# Patient Record
Sex: Male | Born: 1960 | Race: White | Hispanic: No | Smoking: Never smoker
Health system: Southern US, Community
[De-identification: ages and names within clinical notes are randomized; demographics above are authoritative.]

## PROBLEM LIST (undated history)

## (undated) DIAGNOSIS — R319 Hematuria, unspecified: Secondary | ICD-10-CM

## (undated) DIAGNOSIS — N529 Male erectile dysfunction, unspecified: Secondary | ICD-10-CM

## (undated) DIAGNOSIS — E785 Hyperlipidemia, unspecified: Secondary | ICD-10-CM

## (undated) DIAGNOSIS — F1010 Alcohol abuse, uncomplicated: Secondary | ICD-10-CM

## (undated) DIAGNOSIS — E538 Deficiency of other specified B group vitamins: Secondary | ICD-10-CM

## (undated) DIAGNOSIS — F419 Anxiety disorder, unspecified: Secondary | ICD-10-CM

## (undated) DIAGNOSIS — M5412 Radiculopathy, cervical region: Secondary | ICD-10-CM

## (undated) DIAGNOSIS — F329 Major depressive disorder, single episode, unspecified: Secondary | ICD-10-CM

## (undated) DIAGNOSIS — F32A Depression, unspecified: Secondary | ICD-10-CM

## (undated) DIAGNOSIS — M542 Cervicalgia: Secondary | ICD-10-CM

## (undated) DIAGNOSIS — E559 Vitamin D deficiency, unspecified: Secondary | ICD-10-CM

## (undated) DIAGNOSIS — R7989 Other specified abnormal findings of blood chemistry: Secondary | ICD-10-CM

## (undated) HISTORY — DX: Male erectile dysfunction, unspecified: N52.9

## (undated) HISTORY — DX: Hematuria, unspecified: R31.9

## (undated) HISTORY — DX: Radiculopathy, cervical region: M54.12

## (undated) HISTORY — DX: Major depressive disorder, single episode, unspecified: F32.9

## (undated) HISTORY — DX: Hyperlipidemia, unspecified: E78.5

## (undated) HISTORY — DX: Deficiency of other specified B group vitamins: E53.8

## (undated) HISTORY — DX: Other specified abnormal findings of blood chemistry: R79.89

## (undated) HISTORY — DX: Vitamin D deficiency, unspecified: E55.9

## (undated) HISTORY — DX: Anxiety disorder, unspecified: F41.9

## (undated) HISTORY — DX: Alcohol abuse, uncomplicated: F10.10

## (undated) HISTORY — DX: Cervicalgia: M54.2

## (undated) HISTORY — DX: Depression, unspecified: F32.A

## (undated) HISTORY — PX: TONSILECTOMY/ADENOIDECTOMY WITH MYRINGOTOMY: SHX6125

---

## 2018-05-16 ENCOUNTER — Encounter: Payer: Self-pay | Admitting: Internal Medicine

## 2018-07-20 ENCOUNTER — Telehealth: Payer: Self-pay | Admitting: *Deleted

## 2018-07-20 ENCOUNTER — Encounter: Payer: Self-pay | Admitting: *Deleted

## 2018-07-20 ENCOUNTER — Other Ambulatory Visit: Payer: Self-pay | Admitting: *Deleted

## 2018-07-20 ENCOUNTER — Encounter: Payer: Self-pay | Admitting: Nurse Practitioner

## 2018-07-20 ENCOUNTER — Ambulatory Visit (INDEPENDENT_AMBULATORY_CARE_PROVIDER_SITE_OTHER): Payer: No Typology Code available for payment source | Admitting: Nurse Practitioner

## 2018-07-20 ENCOUNTER — Encounter: Payer: Self-pay | Admitting: Gastroenterology

## 2018-07-20 ENCOUNTER — Other Ambulatory Visit: Payer: Self-pay

## 2018-07-20 DIAGNOSIS — Z800 Family history of malignant neoplasm of digestive organs: Secondary | ICD-10-CM | POA: Diagnosis not present

## 2018-07-20 MED ORDER — PEG 3350-KCL-NA BICARB-NACL 420 G PO SOLR: 4000.0000 mL | Freq: Once | ORAL | 0 refills | Status: AC

## 2018-07-20 NOTE — Patient Instructions (Signed)
Your health issues we discussed today were:   Family history of colon cancer/need for colonoscopy: 1. We will schedule your colonoscopy for you 2. Further recommendations will be made after your colonoscopy  Overall I recommend:  1. Return for follow-up based on recommendations made after your colonoscopy 2. Call us if you have any questions or concerns.    Because of recent events of COVID-19 ("Coronavirus"), follow CDC recommendations:  Wash your hand frequently Avoid touching your face Stay away from people who are sick If you have symptoms such as fever, cough, shortness of breath then call your healthcare provider for further guidance If you are sick, STAY AT HOME unless otherwise directed by your healthcare provider.    At [facility] we value your feedback. You may receive a survey about your visit today. Please share your experience as we strive to create trusting relationships with our patients to provide genuine, compassionate, quality care.  We appreciate your understanding and patience as we review any laboratory studies, imaging, and other diagnostic tests that are ordered as we care for you. Our office policy is 5 business days for review of these results, and any emergent or urgent results are addressed in a timely manner for your best interest. If you do not hear from our office in 1 week, please contact us.   We also encourage the use of MyChart, which contains your medical information for your review as well. If you are not enrolled in this feature, an access code is on this after visit summary for your convenience. Thank you for allowing [place] to be involved in your care.  It was great to see you today!  I hope you have a great day!!

## 2018-07-20 NOTE — Progress Notes (Addendum)
REVIEWED-RSC TCS AFTER JUN 1 DUE TO COVID 19 RESTRICTIONS.  Primary Care Physician:  [facility] Primary Gastroenterologist:  Dr. Oneida Alar  Chief Complaint  Patient presents with  . Colonoscopy    HPI:   Joshua Velasquez is a 58 y.o. male who presents on referral from the [place] to schedule a colonoscopy.  Nurse/phone triage was deferred office visit due to alcohol intake. Reviewed information from the [place] including referral information indicating extensive family history of colon cancer and last colonoscopy in 2014.  He is currently due for screening/surveillance.  No history of colonoscopy in our system.  Today he states he's doing ok overall. Notes intermittent hematochezia about "every so often". Denies abdominal pain, N/V, melena, fever, chills, unintentional weight loss. Denies chest pain, dyspnea, dizziness, lightheadedness, syncope, near syncope. Denies any other upper or lower GI symptoms.  NOT ON ANY MEDICATIONS CURRENTLY  History reviewed. No pertinent past medical history.  History reviewed. No pertinent surgical history.  No current outpatient medications on file.   No current facility-administered medications for this visit.     Allergies as of 07/20/2018  . (No Known Allergies)    Family History  Problem Relation Age of Onset  . Colon cancer Mother   . Colon cancer Father   . Colon cancer Maternal Uncle   . Colon cancer Maternal Aunt   . Colon cancer Paternal Aunt   . Colon cancer Paternal Uncle     Social History   Socioeconomic History  . Marital status: Divorced    Spouse name: Not on file  . Number of children: Not on file  . Years of education: Not on file  . Highest education level: Not on file  Occupational History  . Not on file  Social Needs  . Financial resource strain: Not on file  . Food insecurity:    Worry: Not on file    Inability: Not on file  . Transportation needs:    Medical: Not on file    Non-medical: Not on file   Tobacco Use  . Smoking status: Never Smoker  . Smokeless tobacco: Current User    Types: Chew  . Tobacco comment: "I'll stop chew when they put me in the ground."  Substance and Sexual Activity  . Alcohol use: Yes    Comment: A case of beer per week  . Drug use: Not Currently    Comment: quit 2007 (done anything but pills)  . Sexual activity: Not on file  Lifestyle  . Physical activity:    Days per week: Not on file    Minutes per session: Not on file  . Stress: Not on file  Relationships  . Social connections:    Talks on phone: Not on file    Gets together: Not on file    Attends religious service: Not on file    Active member of club or organization: Not on file    Attends meetings of clubs or organizations: Not on file    Relationship status: Not on file  . Intimate partner violence:    Fear of current or ex partner: Not on file    Emotionally abused: Not on file    Physically abused: Not on file    Forced sexual activity: Not on file  Other Topics Concern  . Not on file  Social History Narrative  . Not on file    Review of Systems: Complete ROS negative except as per HPI.    Physical Exam: BP  118/74   Pulse 64   Temp (!) 97 F (36.1 C) (Oral)   Ht 6\' 1"  (1.854 m)   Wt 199 lb 9.6 oz (90.5 kg)   BMI 26.33 kg/m  General:   Alert and oriented. Pleasant and cooperative. Well-nourished and well-developed.  Eyes:  Without icterus, sclera clear and conjunctiva pink.  Ears:  Normal auditory acuity. Cardiovascular:  S1, S2 present without murmurs appreciated. Extremities without clubbing or edema. Respiratory:  Clear to auscultation bilaterally. No wheezes, rales, or rhonchi. No distress.  Gastrointestinal:  +BS, soft, non-tender and non-distended. No HSM noted. No guarding or rebound. No masses appreciated.  Rectal:  Deferred  Musculoskalatal:  Symmetrical without gross deformities. Skin:  Intact without significant lesions or rashes. Neurologic:  Alert and  oriented x4;  grossly normal neurologically. Psych:  Alert and cooperative. Normal mood and affect. Heme/Lymph/Immune: No excessive bruising noted.    07/20/2018 11:51 AM   Disclaimer: This note was dictated with voice recognition software. Similar sounding words can inadvertently be transcribed and may not be corrected upon review.

## 2018-07-20 NOTE — Telephone Encounter (Signed)
Pre-op scheduled for 09/22/2018 at 10:00am. Pt aware. Letter mailed.

## 2018-07-20 NOTE — Assessment & Plan Note (Addendum)
Extensive family history of colon cancer in his mother, father, multiple aunts and uncles.  Last colonoscopy 2014 and currently due.  No previous colonoscopy report available.  He is generally asymptomatic from a GI standpoint today.  We will proceed with colonoscopy at this time based on [place] requests.  Proceed with colonoscopy on propofol/MAC with Dr. Oneida Alar in the near future. The risks, benefits, and alternatives have been discussed in detail with the patient. They state understanding and desire to proceed.   The patient is not on any medications.  He drinks about a case of beer a week.  No recreational drug use.  We will plan for the procedure on propofol/MAC to promote adequate sedation.

## 2018-07-24 NOTE — Progress Notes (Signed)
CC'D TO PCP °

## 2018-09-13 ENCOUNTER — Telehealth: Payer: Self-pay | Admitting: *Deleted

## 2018-09-13 NOTE — Telephone Encounter (Signed)
Per [facility]: REVIEWED-RSC TCS AFTER JUN 1 DUE TO COVID 19 RESTRICTIONS.

## 2018-09-13 NOTE — Telephone Encounter (Signed)
Called patient and made aware d/y COVID-19 restrictions we will need to r/s procedure that is currently scheduled for 09/28/2018 with [facility]. He voiced understanding. Patient aware we will call him to r/s once we receive august schedule. Endo is aware

## 2018-09-22 ENCOUNTER — Other Ambulatory Visit (HOSPITAL_COMMUNITY): Payer: Non-veteran care

## 2018-09-28 ENCOUNTER — Ambulatory Visit (HOSPITAL_COMMUNITY): Admit: 2018-09-28 | Payer: Non-veteran care | Admitting: Gastroenterology

## 2018-09-28 ENCOUNTER — Encounter (HOSPITAL_COMMUNITY): Payer: Self-pay

## 2018-09-28 SURGERY — COLONOSCOPY WITH PROPOFOL
Anesthesia: Monitor Anesthesia Care

## 2018-10-09 ENCOUNTER — Telehealth: Payer: Self-pay | Admitting: *Deleted

## 2018-10-09 NOTE — Telephone Encounter (Signed)
LMOVM to call back to schedule TCS with propofol with [facility]

## 2018-10-10 ENCOUNTER — Other Ambulatory Visit: Payer: Self-pay | Admitting: *Deleted

## 2018-10-10 DIAGNOSIS — Z800 Family history of malignant neoplasm of digestive organs: Secondary | ICD-10-CM

## 2018-10-10 NOTE — Telephone Encounter (Signed)
Spoke with patient. He is scheduled for 9/22 at 7:30am. Patient aware will mail new instructions with new pre-op appt. He already has his prep at home. Confirmed mailing address. Orders entered.

## 2018-10-11 NOTE — Telephone Encounter (Signed)
Pre-op appt mailed with instructions 

## 2018-12-01 ENCOUNTER — Other Ambulatory Visit: Payer: Self-pay

## 2018-12-01 ENCOUNTER — Emergency Department (HOSPITAL_COMMUNITY)
Admission: EM | Admit: 2018-12-01 | Discharge: 2018-12-02 | Disposition: A | Discharge status: Home / Self Care | Payer: No Typology Code available for payment source | Attending: Emergency Medicine | Admitting: Emergency Medicine

## 2018-12-01 DIAGNOSIS — F1420 Cocaine dependence, uncomplicated: Secondary | ICD-10-CM | POA: Insufficient documentation

## 2018-12-01 DIAGNOSIS — F17290 Nicotine dependence, other tobacco product, uncomplicated: Secondary | ICD-10-CM | POA: Diagnosis not present

## 2018-12-01 DIAGNOSIS — F329 Major depressive disorder, single episode, unspecified: Secondary | ICD-10-CM | POA: Insufficient documentation

## 2018-12-01 DIAGNOSIS — F1020 Alcohol dependence, uncomplicated: Secondary | ICD-10-CM | POA: Diagnosis not present

## 2018-12-01 DIAGNOSIS — R45851 Suicidal ideations: Secondary | ICD-10-CM | POA: Insufficient documentation

## 2018-12-01 DIAGNOSIS — Z046 Encounter for general psychiatric examination, requested by authority: Secondary | ICD-10-CM | POA: Diagnosis present
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
## 2018-12-02 ENCOUNTER — Encounter (HOSPITAL_COMMUNITY): Payer: Self-pay

## 2018-12-02 ENCOUNTER — Other Ambulatory Visit: Payer: Self-pay

## 2018-12-02 LAB — COMPREHENSIVE METABOLIC PANEL
ALT: 23 U/L (ref 0–44)
AST: 28 U/L (ref 15–41)
Albumin: 4.6 g/dL (ref 3.5–5.0)
Alkaline Phosphatase: 76 U/L (ref 38–126)
Anion gap: 9 (ref 5–15)
BUN: 24 mg/dL — ABNORMAL HIGH (ref 6–20)
CO2: 25 mmol/L (ref 22–32)
Calcium: 8.6 mg/dL — ABNORMAL LOW (ref 8.9–10.3)
Chloride: 105 mmol/L (ref 98–111)
Creatinine, Ser: 1.17 mg/dL (ref 0.61–1.24)
GFR calc Af Amer: >60 mL/min (ref >60)
GFR calc non Af Amer: >60 mL/min (ref >60)
Glucose, Bld: 103 mg/dL — ABNORMAL HIGH (ref 70–99)
Potassium: 3.7 mmol/L (ref 3.5–5.1)
Sodium: 139 mmol/L (ref 135–145)
Total Bilirubin: 1 mg/dL (ref 0.3–1.2)
Total Protein: 7.5 g/dL (ref 6.5–8.1)

## 2018-12-02 LAB — RAPID URINE DRUG SCREEN, HOSP PERFORMED
Amphetamines: NOT DETECTED
Barbiturates: NOT DETECTED
Benzodiazepines: NOT DETECTED
Cocaine: POSITIVE — AB
Opiates: NOT DETECTED
Tetrahydrocannabinol: POSITIVE — AB

## 2018-12-02 LAB — CBC
HCT: 43.8 % (ref 39.0–52.0)
Hemoglobin: 14.5 g/dL (ref 13.0–17.0)
MCH: 30.7 pg (ref 26.0–34.0)
MCHC: 33.1 g/dL (ref 30.0–36.0)
MCV: 92.8 fL (ref 80.0–100.0)
Platelets: 285 10*3/uL (ref 150–400)
RBC: 4.72 MIL/uL (ref 4.22–5.81)
RDW: 12.5 % (ref 11.5–15.5)
WBC: 9 10*3/uL (ref 4.0–10.5)
nRBC: 0 % (ref 0.0–0.2)

## 2018-12-02 LAB — ETHANOL: Alcohol, Ethyl (B): <10 mg/dL (ref <10)

## 2018-12-02 MED ORDER — THIAMINE HCL 100 MG/ML IJ SOLN: 100.0000 mg | Freq: Every day | INTRAMUSCULAR | Status: DC

## 2018-12-02 MED ORDER — ZOLPIDEM TARTRATE 5 MG PO TABS
5.0000 mg | ORAL_TABLET | Freq: Every evening | ORAL | Status: DC | PRN
  Administered 2018-12-02: 5 mg via ORAL
  Filled 2018-12-02: qty 1

## 2018-12-02 MED ORDER — ONDANSETRON HCL 4 MG PO TABS: 4.0000 mg | ORAL_TABLET | Freq: Three times a day (TID) | ORAL | Status: DC | PRN

## 2018-12-02 MED ORDER — LORAZEPAM 1 MG PO TABS
0.0000 mg | ORAL_TABLET | Freq: Four times a day (QID) | ORAL | Status: DC
  Administered 2018-12-02: 2 mg via ORAL
  Filled 2018-12-02: qty 2

## 2018-12-02 MED ORDER — ACETAMINOPHEN 325 MG PO TABS: 650.0000 mg | ORAL_TABLET | ORAL | Status: DC | PRN

## 2018-12-02 MED ORDER — LORAZEPAM 2 MG/ML IJ SOLN: 0.0000 mg | Freq: Four times a day (QID) | INTRAMUSCULAR | Status: DC

## 2018-12-02 MED ORDER — LORAZEPAM 1 MG PO TABS: 0.0000 mg | ORAL_TABLET | Freq: Two times a day (BID) | ORAL | Status: DC

## 2018-12-02 MED ORDER — ALUM & MAG HYDROXIDE-SIMETH 200-200-20 MG/5ML PO SUSP: 30.0000 mL | Freq: Four times a day (QID) | ORAL | Status: DC | PRN

## 2018-12-02 MED ORDER — LORAZEPAM 2 MG/ML IJ SOLN: 0.0000 mg | Freq: Two times a day (BID) | INTRAMUSCULAR | Status: DC

## 2018-12-02 MED ORDER — VITAMIN B-1 100 MG PO TABS: 100.0000 mg | ORAL_TABLET | Freq: Every day | ORAL | Status: DC

## 2018-12-02 NOTE — BH Assessment (Signed)
[place] Assessment Progress Note   Per Priscille Loveless, NP, inpatient treatment is recommended

## 2018-12-02 NOTE — ED Provider Notes (Signed)
Patient rechecked prior to discharge.  No suicidal or homicidal ideation.  Will discharge for community mental health follow-up   Nat Christen, MD 12/02/18 1207
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
## 2018-12-02 NOTE — ED Notes (Signed)
TTS in progress 
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
## 2018-12-02 NOTE — Discharge Instructions (Addendum)
Behavioral health has cleared you to go home.  Follow-up with community mental health resources.
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
## 2018-12-02 NOTE — ED Notes (Signed)
Ford from [facility] called to state Pt would need to be re-evaluated in the morning.

## 2018-12-02 NOTE — ED Notes (Addendum)
IVC paperwork faxed to [facility] @ [phone]

## 2018-12-02 NOTE — ED Notes (Signed)
Tele-psych talking to patient now.
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
## 2019-01-25 NOTE — Patient Instructions (Signed)
Joshua Velasquez  01/25/2019     @PREFPERIOPPHARMACY @   Your procedure is scheduled on  01/30/2019.  Report to [facility] at [phone]  A.M.  Call this number if you have problems the morning of surgery:  [phone]   Remember:  Follow the diet and prep instructions given to you by Dr Nona Dell office.                       Take these medicines the morning of surgery with A SIP OF WATER  None    Do not wear jewelry, make-up or nail polish.  Do not wear lotions, powders, or perfumes. Please wear deodorant and brush your teeth.  Do not shave 48 hours prior to surgery.  Men may shave face and neck.  Do not bring valuables to the hospital.  [facility] is not responsible for any belongings or valuables.  Contacts, dentures or bridgework may not be worn into surgery.  Leave your suitcase in the car.  After surgery it may be brought to your room.  For patients admitted to the hospital, discharge time will be determined by your treatment team.  Patients discharged the day of surgery will not be allowed to drive home.   Name and phone number of your driver:   family Special instructions:  None  Please read over the following fact sheets that you were given. Anesthesia Post-op Instructions and Care and Recovery After Surgery       Colonoscopy, Adult, Care After This sheet gives you information about how to care for yourself after your procedure. Your health care provider may also give you more specific instructions. If you have problems or questions, contact your health care provider. What can I expect after the procedure? After the procedure, it is common to have:  A small amount of blood in your stool for 24 hours after the procedure.  Some gas.  Mild abdominal cramping or bloating. Follow these instructions at home: General instructions  For the first 24 hours after the procedure: ? Do not drive or use machinery. ? Do not sign important documents. ? Do not drink  alcohol. ? Do your regular daily activities at a slower pace than normal. ? Eat soft, easy-to-digest foods.  Take over-the-counter or prescription medicines only as told by your health care provider. Relieving cramping and bloating   Try walking around when you have cramps or feel bloated.  Apply heat to your abdomen as told by your health care provider. Use a heat source that your health care provider recommends, such as a moist heat pack or a heating pad. ? Place a towel between your skin and the heat source. ? Leave the heat on for 20-30 minutes. ? Remove the heat if your skin turns bright red. This is especially important if you are unable to feel pain, heat, or cold. You may have a greater risk of getting burned. Eating and drinking   Drink enough fluid to keep your urine pale yellow.  Resume your normal diet as instructed by your health care provider. Avoid heavy or fried foods that are hard to digest.  Avoid drinking alcohol for as long as instructed by your health care provider. Contact a health care provider if:  You have blood in your stool 2-3 days after the procedure. Get help right away if:  You have more than a small spotting of blood in your stool.  You pass large blood  clots in your stool.  Your abdomen is swollen.  You have nausea or vomiting.  You have a fever.  You have increasing abdominal pain that is not relieved with medicine. Summary  After the procedure, it is common to have a small amount of blood in your stool. You may also have mild abdominal cramping and bloating.  For the first 24 hours after the procedure, do not drive or use machinery, sign important documents, or drink alcohol.  Contact your health care provider if you have a lot of blood in your stool, nausea or vomiting, a fever, or increased abdominal pain. This information is not intended to replace advice given to you by your health care provider. Make sure you discuss any questions  you have with your health care provider. Document Released: 12/09/2003 Document Revised: 02/16/2017 Document Reviewed: 07/08/2015 Elsevier Patient Education  2020 [place] After These instructions provide you with information about caring for yourself after your procedure. Your health care provider may also give you more specific instructions. Your treatment has been planned according to current medical practices, but problems sometimes occur. Call your health care provider if you have any problems or questions after your procedure. What can I expect after the procedure? After your procedure, you may:  Feel sleepy for several hours.  Feel clumsy and have poor balance for several hours.  Feel forgetful about what happened after the procedure.  Have poor judgment for several hours.  Feel nauseous or vomit.  Have a sore throat if you had a breathing tube during the procedure. Follow these instructions at home: For at least 24 hours after the procedure:      Have a responsible adult stay with you. It is important to have someone help care for you until you are awake and alert.  Rest as needed.  Do not: ? Participate in activities in which you could fall or become injured. ? Drive. ? Use heavy machinery. ? Drink alcohol. ? Take sleeping pills or medicines that cause drowsiness. ? Make important decisions or sign legal documents. ? Take care of children on your own. Eating and drinking  Follow the diet that is recommended by your health care provider.  If you vomit, drink water, juice, or soup when you can drink without vomiting.  Make sure you have little or no nausea before eating solid foods. General instructions  Take over-the-counter and prescription medicines only as told by your health care provider.  If you have sleep apnea, surgery and certain medicines can increase your risk for breathing problems. Follow instructions from your  health care provider about wearing your sleep device: ? Anytime you are sleeping, including during daytime naps. ? While taking prescription pain medicines, sleeping medicines, or medicines that make you drowsy.  If you smoke, do not smoke without supervision.  Keep all follow-up visits as told by your health care provider. This is important. Contact a health care provider if:  You keep feeling nauseous or you keep vomiting.  You feel light-headed.  You develop a rash.  You have a fever. Get help right away if:  You have trouble breathing. Summary  For several hours after your procedure, you may feel sleepy and have poor judgment.  Have a responsible adult stay with you for at least 24 hours or until you are awake and alert. This information is not intended to replace advice given to you by your health care provider. Make sure you discuss any questions you  have with your health care provider. Document Released: 08/17/2015 Document Revised: 07/25/2017 Document Reviewed: 08/17/2015 Elsevier Patient Education  2020 [employer].

## 2019-01-26 ENCOUNTER — Encounter (HOSPITAL_COMMUNITY)
Admission: RE | Admit: 2019-01-26 | Discharge: 2019-01-26 | Disposition: A | Discharge status: Home / Self Care | Payer: No Typology Code available for payment source | Source: Ambulatory Visit | Attending: Gastroenterology | Admitting: Gastroenterology

## 2019-01-26 ENCOUNTER — Other Ambulatory Visit: Payer: Self-pay

## 2019-01-26 ENCOUNTER — Encounter (HOSPITAL_COMMUNITY): Payer: Self-pay

## 2019-01-26 ENCOUNTER — Other Ambulatory Visit (HOSPITAL_COMMUNITY)
Admission: RE | Admit: 2019-01-26 | Discharge: 2019-01-26 | Disposition: A | Discharge status: Home / Self Care | Payer: No Typology Code available for payment source | Source: Ambulatory Visit | Attending: Gastroenterology | Admitting: Gastroenterology

## 2019-01-26 DIAGNOSIS — Z01812 Encounter for preprocedural laboratory examination: Secondary | ICD-10-CM | POA: Insufficient documentation

## 2019-01-26 DIAGNOSIS — K5790 Diverticulosis of intestine, part unspecified, without perforation or abscess without bleeding: Secondary | ICD-10-CM | POA: Diagnosis not present

## 2019-01-26 DIAGNOSIS — K649 Unspecified hemorrhoids: Secondary | ICD-10-CM | POA: Diagnosis not present

## 2019-01-26 DIAGNOSIS — K635 Polyp of colon: Secondary | ICD-10-CM | POA: Insufficient documentation

## 2019-01-26 DIAGNOSIS — Z20828 Contact with and (suspected) exposure to other viral communicable diseases: Secondary | ICD-10-CM | POA: Diagnosis not present

## 2019-01-26 DIAGNOSIS — Z8371 Family history of colonic polyps: Secondary | ICD-10-CM | POA: Insufficient documentation

## 2019-01-26 LAB — COMPREHENSIVE METABOLIC PANEL
ALT: 22 U/L (ref 0–44)
AST: 27 U/L (ref 15–41)
Albumin: 4.1 g/dL (ref 3.5–5.0)
Alkaline Phosphatase: 93 U/L (ref 38–126)
Anion gap: 6 (ref 5–15)
BUN: 17 mg/dL (ref 6–20)
CO2: 30 mmol/L (ref 22–32)
Calcium: 8.7 mg/dL — ABNORMAL LOW (ref 8.9–10.3)
Chloride: 102 mmol/L (ref 98–111)
Creatinine, Ser: 0.91 mg/dL (ref 0.61–1.24)
GFR calc Af Amer: >60 mL/min (ref >60)
GFR calc non Af Amer: >60 mL/min (ref >60)
Glucose, Bld: 137 mg/dL — ABNORMAL HIGH (ref 70–99)
Potassium: 4.3 mmol/L (ref 3.5–5.1)
Sodium: 138 mmol/L (ref 135–145)
Total Bilirubin: 0.4 mg/dL (ref 0.3–1.2)
Total Protein: 7 g/dL (ref 6.5–8.1)

## 2019-01-26 LAB — SARS CORONAVIRUS 2 (TAT 6-24 HRS): SARS Coronavirus 2: NEGATIVE

## 2019-01-30 ENCOUNTER — Ambulatory Visit (HOSPITAL_COMMUNITY): Payer: No Typology Code available for payment source | Admitting: Anesthesiology

## 2019-01-30 ENCOUNTER — Encounter (HOSPITAL_COMMUNITY)
Admission: RE | Disposition: A | Discharge status: Home / Self Care | Payer: Self-pay | Source: Home / Self Care | Attending: Gastroenterology

## 2019-01-30 ENCOUNTER — Encounter (HOSPITAL_COMMUNITY): Payer: Self-pay | Admitting: *Deleted

## 2019-01-30 ENCOUNTER — Ambulatory Visit (HOSPITAL_COMMUNITY)
Admission: RE | Admit: 2019-01-30 | Discharge: 2019-01-30 | Disposition: A | Discharge status: Home / Self Care | Payer: No Typology Code available for payment source | Attending: Gastroenterology | Admitting: Gastroenterology

## 2019-01-30 ENCOUNTER — Other Ambulatory Visit: Payer: Self-pay

## 2019-01-30 DIAGNOSIS — Z1211 Encounter for screening for malignant neoplasm of colon: Secondary | ICD-10-CM | POA: Diagnosis not present

## 2019-01-30 DIAGNOSIS — D128 Benign neoplasm of rectum: Secondary | ICD-10-CM | POA: Insufficient documentation

## 2019-01-30 DIAGNOSIS — K621 Rectal polyp: Secondary | ICD-10-CM

## 2019-01-30 DIAGNOSIS — F17220 Nicotine dependence, chewing tobacco, uncomplicated: Secondary | ICD-10-CM | POA: Insufficient documentation

## 2019-01-30 DIAGNOSIS — K648 Other hemorrhoids: Secondary | ICD-10-CM | POA: Insufficient documentation

## 2019-01-30 DIAGNOSIS — Z800 Family history of malignant neoplasm of digestive organs: Secondary | ICD-10-CM | POA: Diagnosis not present

## 2019-01-30 DIAGNOSIS — K635 Polyp of colon: Secondary | ICD-10-CM | POA: Diagnosis not present

## 2019-01-30 DIAGNOSIS — K644 Residual hemorrhoidal skin tags: Secondary | ICD-10-CM | POA: Diagnosis not present

## 2019-01-30 DIAGNOSIS — D122 Benign neoplasm of ascending colon: Secondary | ICD-10-CM | POA: Insufficient documentation

## 2019-01-30 DIAGNOSIS — K5730 Diverticulosis of large intestine without perforation or abscess without bleeding: Secondary | ICD-10-CM | POA: Insufficient documentation

## 2019-01-30 HISTORY — PX: COLONOSCOPY WITH PROPOFOL: SHX5780

## 2019-01-30 HISTORY — PX: POLYPECTOMY: SHX5525

## 2019-01-30 SURGERY — COLONOSCOPY WITH PROPOFOL
Anesthesia: General

## 2019-01-30 MED ORDER — KETAMINE HCL 50 MG/5ML IJ SOSY
PREFILLED_SYRINGE | INTRAMUSCULAR | Status: AC
  Filled 2019-01-30: qty 5

## 2019-01-30 MED ORDER — CHLORHEXIDINE GLUCONATE CLOTH 2 % EX PADS: 6.0000 | MEDICATED_PAD | Freq: Once | CUTANEOUS | Status: DC

## 2019-01-30 MED ORDER — MIDAZOLAM HCL 2 MG/2ML IJ SOLN: 0.5000 mg | Freq: Once | INTRAMUSCULAR | Status: DC | PRN

## 2019-01-30 MED ORDER — PROPOFOL 10 MG/ML IV BOLUS
INTRAVENOUS | Status: DC | PRN
  Administered 2019-01-30: 20 mg via INTRAVENOUS

## 2019-01-30 MED ORDER — LACTATED RINGERS IV SOLN
INTRAVENOUS | Status: DC
  Administered 2019-01-30: 07:00:00 via INTRAVENOUS

## 2019-01-30 MED ORDER — ONDANSETRON HCL 4 MG/2ML IJ SOLN
INTRAMUSCULAR | Status: AC
  Filled 2019-01-30: qty 2

## 2019-01-30 MED ORDER — HYDROCODONE-ACETAMINOPHEN 7.5-325 MG PO TABS: 1.0000 | ORAL_TABLET | Freq: Once | ORAL | Status: DC | PRN

## 2019-01-30 MED ORDER — PROPOFOL 500 MG/50ML IV EMUL
INTRAVENOUS | Status: DC | PRN
  Administered 2019-01-30: 08:00:00 via INTRAVENOUS
  Administered 2019-01-30: 150 ug/kg/min via INTRAVENOUS

## 2019-01-30 MED ORDER — LIDOCAINE HCL (CARDIAC) PF 100 MG/5ML IV SOSY
PREFILLED_SYRINGE | INTRAVENOUS | Status: DC | PRN
  Administered 2019-01-30: 40 mg via INTRAVENOUS

## 2019-01-30 MED ORDER — HYDROMORPHONE HCL 1 MG/ML IJ SOLN: 0.2500 mg | INTRAMUSCULAR | Status: DC | PRN

## 2019-01-30 MED ORDER — GLYCOPYRROLATE 0.2 MG/ML IJ SOLN
INTRAMUSCULAR | Status: DC | PRN
  Administered 2019-01-30: 0.2 mg via INTRAVENOUS

## 2019-01-30 MED ORDER — PROMETHAZINE HCL 25 MG/ML IJ SOLN: 6.2500 mg | INTRAMUSCULAR | Status: DC | PRN

## 2019-01-30 MED ORDER — KETAMINE HCL 10 MG/ML IJ SOLN
INTRAMUSCULAR | Status: DC | PRN
  Administered 2019-01-30: 10 mg via INTRAVENOUS

## 2019-01-30 NOTE — Anesthesia Postprocedure Evaluation (Signed)
Anesthesia Post Note  Patient: Joshua Velasquez  Procedure(s) Performed: COLONOSCOPY WITH PROPOFOL (N/A ) POLYPECTOMY  Patient location during evaluation: PACU Anesthesia Type: General Level of consciousness: awake and alert and oriented Pain management: pain level controlled Vital Signs Assessment: post-procedure vital signs reviewed and stable Respiratory status: spontaneous breathing Cardiovascular status: stable Postop Assessment: no apparent nausea or vomiting Anesthetic complications: no     Last Vitals:  Vitals:   01/30/19 0643  BP: 115/80  Pulse: (!) 56  Resp: 18  Temp: 36.7 C  SpO2: 95%    Last Pain:  Vitals:   01/30/19 0749  TempSrc:   PainSc: 0-No pain                 Cassey Bacigalupo A

## 2019-01-30 NOTE — Discharge Instructions (Signed)
You have internal hemorrhoids and diverticulosis IN YOUR LEFT COLON. YOU HAD FOUR POLYPS REMOVED.    DRINK WATER TO KEEP YOUR URINE LIGHT YELLOW.  FOLLOW A HIGH FIBER DIET. AVOID ITEMS THAT CAUSE BLOATING. See info below.   USE PREPARATION H FOUR TIMES  A DAY IF NEEDED TO RELIEVE RECTAL PAIN/PRESSURE/BLEEDING.   YOUR BIOPSY RESULTS WILL BE BACK IN 5 BUSINESS DAYS.  Next colonoscopy WILL BE SCHEDULED BASED ON THE FINAL PATH REPORT(3-5 YRS).   Colonoscopy Care After Read the instructions outlined below and refer to this sheet in the next week. These discharge instructions provide you with general information on caring for yourself after you leave the hospital. While your treatment has been planned according to the most current medical practices available, unavoidable complications occasionally occur. If you have any problems or questions after discharge, call DR. Annelisa Ryback, [phone].  ACTIVITY  You may resume your regular activity, but move at a slower pace for the next 24 hours.   Take frequent rest periods for the next 24 hours.   Walking will help get rid of the air and reduce the bloated feeling in your belly (abdomen).   No driving for 24 hours (because of the medicine (anesthesia) used during the test).   You may shower.   Do not sign any important legal documents or operate any machinery for 24 hours (because of the anesthesia used during the test).    NUTRITION  Drink plenty of fluids.   You may resume your normal diet as instructed by your doctor.   Begin with a light meal and progress to your normal diet. Heavy or fried foods are harder to digest and may make you feel sick to your stomach (nauseated).   Avoid alcoholic beverages for 24 hours or as instructed.    MEDICATIONS  You may resume your normal medications.   WHAT YOU CAN EXPECT TODAY  Some feelings of bloating in the abdomen.   Passage of more gas than usual.   Spotting of blood in your stool  or on the toilet paper  .  IF YOU HAD POLYPS REMOVED DURING THE COLONOSCOPY:  Eat a soft diet IF YOU HAVE NAUSEA, BLOATING, ABDOMINAL PAIN, OR VOMITING.    FINDING OUT THE RESULTS OF YOUR TEST Not all test results are available during your visit. DR. Oneida Alar WILL CALL YOU WITHIN 14 DAYS OF YOUR PROCEDUE WITH YOUR RESULTS. Do not assume everything is normal if you have not heard from DR. Shaindy Reader, CALL HER OFFICE AT [phone].  SEEK IMMEDIATE MEDICAL ATTENTION AND CALL THE OFFICE: [phone] IF:  You have more than a spotting of blood in your stool.   Your belly is swollen (abdominal distention).   You are nauseated or vomiting.   You have a temperature over 101F.   You have abdominal pain or discomfort that is severe or gets worse throughout the day.  High-Fiber Diet A high-fiber diet changes your normal diet to include more whole grains, legumes, fruits, and vegetables. Changes in the diet involve replacing refined carbohydrates with unrefined foods. The calorie level of the diet is essentially unchanged. The Dietary Reference Intake (recommended amount) for adult males is 38 grams per day. For adult females, it is 25 grams per day. Pregnant and lactating women should consume 28 grams of fiber per day. Fiber is the intact part of a plant that is not broken down during digestion. Functional fiber is fiber that has been isolated from the plant to provide a beneficial effect  in the body.  PURPOSE  Increase stool bulk.   Ease and regulate bowel movements.   Lower cholesterol.   REDUCE RISK OF COLON CANCER  INDICATIONS THAT YOU NEED MORE FIBER  Constipation and hemorrhoids.   Uncomplicated diverticulosis (intestine condition) and irritable bowel syndrome.   Weight management.   As a protective measure against hardening of the arteries (atherosclerosis), diabetes, and cancer.   GUIDELINES FOR INCREASING FIBER IN THE DIET  Start adding fiber to the diet slowly. A gradual  increase of about 5 more grams (2 servings of most fruits or vegetables) per day is best. Too rapid an increase in fiber may result in constipation, flatulence, and bloating.   Drink enough water and fluids to keep your urine clear or pale yellow. Water, juice, or caffeine-free drinks are recommended. Not drinking enough fluid may cause constipation.   Eat a variety of high-fiber foods rather than one type of fiber.   Try to increase your intake of fiber through using high-fiber foods rather than fiber pills or supplements that contain small amounts of fiber.   The goal is to change the types of food eaten. Do not supplement your present diet with high-fiber foods, but replace foods in your present diet.    Polyps, Colon  A polyp is extra tissue that grows inside your body. Colon polyps grow in the large intestine. The large intestine, also called the colon, is part of your digestive system. It is a long, hollow tube at the end of your digestive tract where your body makes and stores stool. Most polyps are not dangerous. They are benign. This means they are not cancerous. But over time, some types of polyps can turn into cancer. Polyps that are smaller than a pea are usually not harmful. But larger polyps could someday become or may already be cancerous. To be safe, doctors remove all polyps and test them.   PREVENTION There is not one sure way to prevent polyps. You might be able to lower your risk of getting them if you:  Eat more fruits and vegetables and less fatty food.   Do not smoke.   Avoid alcohol.   Exercise every day.   Lose weight if you are overweight.   Eating more calcium and folate can also lower your risk of getting polyps. Some foods that are rich in calcium are milk, cheese, and broccoli. Some foods that are rich in folate are chickpeas, kidney beans, and spinach.    Diverticulosis Diverticulosis is a common condition that develops when small pouches (diverticula)  form in the wall of the colon. The risk of diverticulosis increases with age. It happens more often in people who eat a low-fiber diet. Most individuals with diverticulosis have no symptoms. Those individuals with symptoms usually experience belly (abdominal) pain, constipation, or loose stools (diarrhea).  HOME CARE INSTRUCTIONS  Increase the amount of fiber in your diet as directed by your caregiver or dietician. This may reduce symptoms of diverticulosis.   Drink at least 6 to 8 glasses of water each day to prevent constipation.   Try not to strain when you have a bowel movement.   Avoiding nuts and seeds to prevent complications is NOT NECESSARY.   FOODS HAVING HIGH FIBER CONTENT INCLUDE:  Fruits. Apple, peach, pear, tangerine, raisins, prunes.   Vegetables. Brussels sprouts, asparagus, broccoli, cabbage, carrot, cauliflower, romaine lettuce, spinach, summer squash, tomato, winter squash, zucchini.   Starchy Vegetables. Baked beans, kidney beans, lima beans, split peas, lentils, potatoes (  with skin).   SEEK IMMEDIATE MEDICAL CARE IF:  You develop increasing pain or severe bloating.   You have an oral temperature above 101F.   You develop vomiting or bowel movements that are bloody or black.

## 2019-01-30 NOTE — Anesthesia Preprocedure Evaluation (Signed)
Anesthesia Evaluation  Patient identified by MRN, date of birth, ID band Patient awake    Reviewed: Allergy & Precautions, NPO status , Patient's Chart, lab work & pertinent test results  Airway Mallampati: II  TM Distance: >3 FB Neck ROM: Full    Dental no notable dental hx. (+) Teeth Intact   Pulmonary neg pulmonary ROS,  Tobacco chewer   Pulmonary exam normal breath sounds clear to auscultation       Cardiovascular Exercise Tolerance: Good negative cardio ROS Normal cardiovascular examI Rhythm:Regular Rate:Normal     Neuro/Psych PSYCHIATRIC DISORDERS Anxiety Depression  Neuromuscular disease    GI/Hepatic negative GI ROS, Neg liver ROS,   Endo/Other  negative endocrine ROS  Renal/GU negative Renal ROS  negative genitourinary   Musculoskeletal negative musculoskeletal ROS (+)   Abdominal   Peds negative pediatric ROS (+)  Hematology negative hematology ROS (+)   Anesthesia Other Findings   Reproductive/Obstetrics negative OB ROS                             Anesthesia Physical Anesthesia Plan  ASA: II  Anesthesia Plan: General   Post-op Pain Management:    Induction: Intravenous  PONV Risk Score and Plan: 2 and Propofol infusion, TIVA and Treatment may vary due to age or medical condition  Airway Management Planned: Nasal Cannula and Simple Face Mask  Additional Equipment:   Intra-op Plan:   Post-operative Plan:   Informed Consent: I have reviewed the patients History and Physical, chart, labs and discussed the procedure including the risks, benefits and alternatives for the proposed anesthesia with the patient or authorized representative who has indicated his/her understanding and acceptance.     Dental advisory given  Plan Discussed with: CRNA  Anesthesia Plan Comments: (Plan Full PPE use  Plan GA with GETA as needed d/w pt -WTP with same after Q&A)         Anesthesia Quick Evaluation

## 2019-01-30 NOTE — Transfer of Care (Signed)
Immediate Anesthesia Transfer of Care Note  Patient: Josemiguel Settle  Procedure(s) Performed: COLONOSCOPY WITH PROPOFOL (N/A ) POLYPECTOMY  Patient Location: PACU  Anesthesia Type:General  Level of Consciousness: awake, oriented and patient cooperative  Airway & Oxygen Therapy: Patient Spontanous Breathing  Post-op Assessment: Report given to RN and Post -op Vital signs reviewed and stable  Post vital signs: Reviewed and stable  Last Vitals:  Vitals Value Taken Time  BP    Temp    Pulse 82 01/30/19 0809  Resp 22 01/30/19 0809  SpO2 97 % 01/30/19 0809  Vitals shown include unvalidated device data.  Last Pain:  Vitals:   01/30/19 0749  TempSrc:   PainSc: 0-No pain         Complications: No apparent anesthesia complications

## 2019-01-30 NOTE — Op Note (Signed)
[facility] Patient Name: Joshua Velasquez Procedure Date: 01/30/2019 7:10 AM MRN: [id number] Date of Birth: December 01, 1960 Attending MD: Barney Drain MD, MD CSN: [id number] Age: 58 Admit Type: Outpatient Procedure:                Colonoscopy WITH COLD SNARE POLYPECTOMY Indications:              Screening in patient at increased risk: Colorectal                            cancer in mother before age 41, Screening in                            patient at increased risk: Colorectal cancer in                            father 89 or older Providers:                Barney Drain MD, MD, Janeece Riggers, RN, Randa Spike, Technician Referring MD:             Kathalene Frames Medicines:                Propofol per Anesthesia Complications:            No immediate complications. Estimated Blood Loss:     Estimated blood loss was minimal. Procedure:                Pre-Anesthesia Assessment:                           - Prior to the procedure, a History and Physical                            was performed, and patient medications and                            allergies were reviewed. The patient's tolerance of                            previous anesthesia was also reviewed. The risks                            and benefits of the procedure and the sedation                            options and risks were discussed with the patient.                            All questions were answered, and informed consent                            was obtained. Prior Anticoagulants: The patient has  taken no previous anticoagulant or antiplatelet                            agents. ASA Grade Assessment: I - A normal, healthy                            patient. After reviewing the risks and benefits,                            the patient was deemed in satisfactory condition to                            undergo the procedure. After obtaining informed             consent, the colonoscope was passed under direct                            vision. Throughout the procedure, the patient's                            blood pressure, pulse, and oxygen saturations were                            monitored continuously. The [id number]                            scope was introduced through the anus and advanced                            to the the cecum, identified by appendiceal orifice                            and ileocecal valve. The colonoscopy was somewhat                            difficult due to a tortuous colon. Successful                            completion of the procedure was aided by                            straightening and shortening the scope to obtain                            bowel loop reduction and COLOWRAP. The patient                            tolerated the procedure well. The quality of the                            bowel preparation was excellent. The ileocecal                            valve, appendiceal orifice,  and rectum were                            photographed. Scope In: 7:44:42 AM Scope Out: 8:02:55 AM Scope Withdrawal Time: 0 hours 15 minutes 1 second  Total Procedure Duration: 0 hours 18 minutes 13 seconds  Findings:      Four sessile polyps were found in the rectum and ascending colon(3). The       polyps were 2 to 6 mm in size. These polyps were removed with a cold       snare. Resection and retrieval were complete.      A few small-mouthed diverticula were found in the recto-sigmoid colon       and sigmoid colon.      The recto-sigmoid colon and sigmoid colon were mildly tortuous.      External and internal hemorrhoids were found. Impression:               - FOUR 2 to 6 mm polyps in the rectum and in the                            ascending colon, removed with a cold snare.                            Resected and retrieved.                           - MILD Diverticulosis in the  recto-sigmoid colon                            and in the sigmoid colon.                           - MILDLY Tortuous LEFT colon.                           - External and internal hemorrhoids. Moderate Sedation:      Per Anesthesia Care Recommendation:           - Patient has a contact number available for                            emergencies. The signs and symptoms of potential                            delayed complications were discussed with the                            patient. Return to normal activities tomorrow.                            Written discharge instructions were provided to the                            patient.                           - High fiber diet.                           -  Continue present medications.                           - Await pathology results.                           - Repeat colonoscopy date to be determined after                            pending pathology results are reviewed for                            surveillance. Procedure Code(s):        --- Professional ---                           [phone], Colonoscopy, flexible; with removal of                            tumor(s), polyp(s), or other lesion(s) by snare                            technique Diagnosis Code(s):        --- Professional ---                           K62.1, Rectal polyp                           K63.5, Polyp of colon                           K64.8, Other hemorrhoids                           Z80.0, Family history of malignant neoplasm of                            digestive organs                           K57.30, Diverticulosis of large intestine without                            perforation or abscess without bleeding                           Q43.8, Other specified congenital malformations of                            intestine CPT copyright 2019 [employer]. All rights reserved. The codes documented in this report are preliminary and upon  coder review may  be revised to meet current compliance requirements. Barney Drain, MD Barney Drain MD, MD 01/30/2019 8:32:53 AM This report has been signed electronically. Number of Addenda: 0

## 2019-01-30 NOTE — H&P (Signed)
Primary Care Physician:  [facility]ologist:  Dr. Oneida Alar  Pre-Procedure History & Physical: HPI:  Joshua Velasquez is a 58 y.o. male here for FAMILY Hx COLON CA-FATHER HAD COLON CA AGE < 60.  Past Medical History:  Diagnosis Date  . Alcohol abuse   . Anxiety and depression   . Cervical radiculopathy   . Cervicalgia   . Erectile dysfunction   . Hematuria   . Hyperlipidemia   . Low vitamin B12 level   . Vitamin D deficiency     Past Surgical History:  Procedure Laterality Date  . TONSILECTOMY/ADENOIDECTOMY WITH MYRINGOTOMY      Prior to Admission medications   Not on File    Allergies as of 10/10/2018  . (No Known Allergies)    Family History  Problem Relation Age of Onset  . Colon cancer Mother   . Colon cancer Father   . Colon cancer Maternal Uncle   . Colon cancer Maternal Aunt   . Colon cancer Paternal Aunt   . Colon cancer Paternal Uncle     Social History   Socioeconomic History  . Marital status: Divorced    Spouse name: Not on file  . Number of children: Not on file  . Years of education: Not on file  . Highest education level: Not on file  Occupational History  . Not on file  Social Needs  . Financial resource strain: Not on file  . Food insecurity    Worry: Not on file    Inability: Not on file  . Transportation needs    Medical: Not on file    Non-medical: Not on file  Tobacco Use  . Smoking status: Never Smoker  . Smokeless tobacco: Current User    Types: Chew  . Tobacco comment: "I'll stop chew when they put me in the ground."  Substance and Sexual Activity  . Alcohol use: Yes    Comment: A case of beer per week  . Drug use: Not Currently    Comment: quit 2007 (done anything but pills)  . Sexual activity: Not Currently  Lifestyle  . Physical activity    Days per week: Not on file    Minutes per session: Not on file  . Stress: Not on file  Relationships  . Social Herbalist on  phone: Not on file    Gets together: Not on file    Attends religious service: Not on file    Active member of club or organization: Not on file    Attends meetings of clubs or organizations: Not on file    Relationship status: Not on file  . Intimate partner violence    Fear of current or ex partner: Not on file    Emotionally abused: Not on file    Physically abused: Not on file    Forced sexual activity: Not on file  Other Topics Concern  . Not on file  Social History Narrative  . Not on file    Review of Systems: See HPI, otherwise negative ROS   Physical Exam: BP 115/80   Pulse (!) 56   Temp 98 F (36.7 C) (Oral)   Resp 18   Ht 6\' 1"  (1.854 m)   Wt 90.7 kg   SpO2 95%   BMI 26.38 kg/m  General:   Alert,  pleasant and cooperative in NAD Head:  Normocephalic and atraumatic. Neck:  Supple; Lungs:  Clear throughout to auscultation.  Heart:  Regular rate and rhythm. Abdomen:  Soft, nontender and nondistended. Normal bowel sounds, without guarding, and without rebound.   Neurologic:  Alert and  oriented x4;  grossly normal neurologically.  Impression/Plan:     FAMILY Hx COLON CA-FATHER HAD COLON CA AGE > 60.  PLAN: 1. TCS TODAY. DISCUSSED PROCEDURE, BENEFITS, & RISKS: < 1% chance of medication reaction, bleeding, perforation, ASPIRATION, or rupture of spleen/liver requiring surgery to fix it and missed polyps < 1 cm 10-20% of the time.

## 2019-01-31 ENCOUNTER — Telehealth: Payer: Self-pay | Admitting: Gastroenterology

## 2019-01-31 LAB — SURGICAL PATHOLOGY

## 2019-01-31 NOTE — Telephone Encounter (Signed)
PLEASE CALL PT. HE HAD THREE SIMPLE ADENOMAS AND ONE HYPERPLASTIC POLYPS REMOVED.   DRINK WATER TO KEEP YOUR URINE LIGHT YELLOW. FOLLOW A HIGH FIBER DIET. AVOID ITEMS THAT CAUSE BLOATING & GAS.  Next colonoscopy in 3 years.

## 2019-01-31 NOTE — Telephone Encounter (Signed)
PT is aware of results.  

## 2019-02-01 NOTE — Telephone Encounter (Signed)
On recall  °

## 2019-02-02 ENCOUNTER — Encounter (HOSPITAL_COMMUNITY): Payer: Self-pay | Admitting: Gastroenterology

## 2019-05-12 ENCOUNTER — Other Ambulatory Visit: Payer: Self-pay

## 2019-05-12 ENCOUNTER — Emergency Department (HOSPITAL_COMMUNITY)
Admission: EM | Admit: 2019-05-12 | Discharge: 2019-05-12 | Disposition: A | Discharge status: Home / Self Care | Payer: No Typology Code available for payment source | Attending: Emergency Medicine | Admitting: Emergency Medicine

## 2019-05-12 ENCOUNTER — Encounter (HOSPITAL_COMMUNITY): Payer: Self-pay | Admitting: Emergency Medicine

## 2019-05-12 ENCOUNTER — Emergency Department (HOSPITAL_COMMUNITY): Payer: No Typology Code available for payment source

## 2019-05-12 DIAGNOSIS — R509 Fever, unspecified: Secondary | ICD-10-CM | POA: Diagnosis present

## 2019-05-12 DIAGNOSIS — U071 COVID-19: Secondary | ICD-10-CM | POA: Insufficient documentation

## 2019-05-12 DIAGNOSIS — F17290 Nicotine dependence, other tobacco product, uncomplicated: Secondary | ICD-10-CM | POA: Insufficient documentation

## 2019-05-12 LAB — CBC WITH DIFFERENTIAL/PLATELET
Abs Immature Granulocytes: 0.01 10*3/uL (ref 0.00–0.07)
Basophils Absolute: 0 10*3/uL (ref 0.0–0.1)
Basophils Relative: 0 %
Eosinophils Absolute: 0.1 10*3/uL (ref 0.0–0.5)
Eosinophils Relative: 1 %
HCT: 44.3 % (ref 39.0–52.0)
Hemoglobin: 14.9 g/dL (ref 13.0–17.0)
Immature Granulocytes: 0 %
Lymphocytes Relative: 17 %
Lymphs Abs: 1 10*3/uL (ref 0.7–4.0)
MCH: 31.3 pg (ref 26.0–34.0)
MCHC: 33.6 g/dL (ref 30.0–36.0)
MCV: 93.1 fL (ref 80.0–100.0)
Monocytes Absolute: 0.7 10*3/uL (ref 0.1–1.0)
Monocytes Relative: 11 %
Neutro Abs: 4.1 10*3/uL (ref 1.7–7.7)
Neutrophils Relative %: 71 %
Platelets: 283 10*3/uL (ref 150–400)
RBC: 4.76 MIL/uL (ref 4.22–5.81)
RDW: 12.1 % (ref 11.5–15.5)
WBC: 5.8 10*3/uL (ref 4.0–10.5)
nRBC: 0 % (ref 0.0–0.2)

## 2019-05-12 LAB — LACTIC ACID, PLASMA: Lactic Acid, Venous: 0.9 mmol/L (ref 0.5–1.9)

## 2019-05-12 LAB — RESPIRATORY PANEL BY RT PCR (FLU A&B, COVID)
Influenza A by PCR: NEGATIVE
Influenza B by PCR: NEGATIVE
SARS Coronavirus 2 by RT PCR: POSITIVE — AB

## 2019-05-12 LAB — COMPREHENSIVE METABOLIC PANEL
ALT: 25 U/L (ref 0–44)
AST: 28 U/L (ref 15–41)
Albumin: 3.5 g/dL (ref 3.5–5.0)
Alkaline Phosphatase: 77 U/L (ref 38–126)
Anion gap: 12 (ref 5–15)
BUN: 10 mg/dL (ref 6–20)
CO2: 25 mmol/L (ref 22–32)
Calcium: 8.3 mg/dL — ABNORMAL LOW (ref 8.9–10.3)
Chloride: 99 mmol/L (ref 98–111)
Creatinine, Ser: 0.92 mg/dL (ref 0.61–1.24)
GFR calc Af Amer: >60 mL/min (ref >60)
GFR calc non Af Amer: >60 mL/min (ref >60)
Glucose, Bld: 96 mg/dL (ref 70–99)
Potassium: 3.4 mmol/L — ABNORMAL LOW (ref 3.5–5.1)
Sodium: 136 mmol/L (ref 135–145)
Total Bilirubin: 0.6 mg/dL (ref 0.3–1.2)
Total Protein: 7.2 g/dL (ref 6.5–8.1)

## 2019-05-12 LAB — FIBRINOGEN: Fibrinogen: 586 mg/dL — ABNORMAL HIGH (ref 210–475)

## 2019-05-12 LAB — D-DIMER, QUANTITATIVE: D-Dimer, Quant: 0.47 ug/mL-FEU (ref 0.00–0.50)

## 2019-05-12 LAB — TRIGLYCERIDES: Triglycerides: 73 mg/dL (ref <150)

## 2019-05-12 LAB — C-REACTIVE PROTEIN: CRP: 4.1 mg/dL — ABNORMAL HIGH (ref <1.0)

## 2019-05-12 LAB — PROCALCITONIN: Procalcitonin: <0.1 ng/mL

## 2019-05-12 LAB — FERRITIN: Ferritin: 207 ng/mL (ref 24–336)

## 2019-05-12 LAB — POC SARS CORONAVIRUS 2 AG -  ED: SARS Coronavirus 2 Ag: NEGATIVE

## 2019-05-12 LAB — LACTATE DEHYDROGENASE: LDH: 202 U/L — ABNORMAL HIGH (ref 98–192)

## 2019-05-12 NOTE — ED Provider Notes (Signed)
[facility] EMERGENCY DEPARTMENT Provider Note   CSN: [id number] Arrival date & time: 05/12/19  [id number]     History Chief Complaint  Patient presents with  . Fever    Joshua Velasquez is a 59 y.o. male.  HPI Patient presents feeling bad.  States his head few day history of fever cough and weakness.  States his daughter is Covid.  States he had a cough with sputum production.  States he aches all over.  States he has had fatigue.  States he does not taste very well.  Patient does not smoke.    Past Medical History:  Diagnosis Date  . Alcohol abuse   . Anxiety and depression   . Cervical radiculopathy   . Cervicalgia   . Erectile dysfunction   . Hematuria   . Hyperlipidemia   . Low vitamin B12 level   . Vitamin D deficiency     Patient Active Problem List   Diagnosis Date Noted  . Family history of colon cancer 07/20/2018    Past Surgical History:  Procedure Laterality Date  . COLONOSCOPY WITH PROPOFOL N/A 01/30/2019   Procedure: COLONOSCOPY WITH PROPOFOL;  Surgeon: Danie Binder, MD;  Location: [facility] SUITE;  Service: Endoscopy;  Laterality: N/A;  7:30am  . POLYPECTOMY  01/30/2019   Procedure: POLYPECTOMY;  Surgeon: Danie Binder, MD;  Location: [facility];  Service: Endoscopy;;  . TONSILECTOMY/ADENOIDECTOMY WITH MYRINGOTOMY         Family History  Problem Relation Age of Onset  . Colon cancer Mother   . Colon cancer Father   . Colon cancer Maternal Uncle   . Colon cancer Maternal Aunt   . Colon cancer Paternal Aunt   . Colon cancer Paternal Uncle     Social History   Tobacco Use  . Smoking status: Never Smoker  . Smokeless tobacco: Current User    Types: Chew  . Tobacco comment: "I'll stop chew when they put me in the ground."  Substance Use Topics  . Alcohol use: Yes    Comment: A case of beer per week  . Drug use: Not Currently    Comment: quit 2007 (done anything but pills)    Home Medications Prior to Admission medications   Not on  File    Allergies    Patient has no known allergies.  Review of Systems   Review of Systems  Constitutional: Positive for appetite change, fatigue and fever.  HENT: Negative for congestion.   Respiratory: Positive for cough and shortness of breath.   Cardiovascular: Negative for chest pain.  Gastrointestinal: Negative for abdominal pain.  Genitourinary: Negative for flank pain.  Musculoskeletal: Positive for myalgias.  Skin: Negative for pallor.  Neurological: Negative for weakness.  Psychiatric/Behavioral: Negative for confusion.    Physical Exam Updated Vital Signs BP 112/80   Pulse 66   Temp 99 F (37.2 C) (Oral)   Resp (!) 25   Ht 6\' 1"  (1.854 m)   Wt 90.7 kg   SpO2 95%   BMI 26.39 kg/m   Physical Exam Vitals and nursing note reviewed.  HENT:     Head: Atraumatic.  Eyes:     Extraocular Movements: Extraocular movements intact.  Pulmonary:     Comments: Mildly harsh breath sounds without focal rales or rhonchi. Abdominal:     Tenderness: There is no abdominal tenderness.  Musculoskeletal:     Cervical back: Neck supple.     Right lower leg: No edema.  Left lower leg: No edema.  Skin:    General: Skin is warm.     Capillary Refill: Capillary refill takes less than 2 seconds.  Neurological:     Mental Status: He is alert and oriented to person, place, and time.     ED Results / Procedures / Treatments   Labs (all labs ordered are listed, but only abnormal results are displayed) Labs Reviewed  RESPIRATORY PANEL BY RT PCR (FLU A&B, COVID) - Abnormal; Notable for the following components:      Result Value   SARS Coronavirus 2 by RT PCR POSITIVE (*)    All other components within normal limits  COMPREHENSIVE METABOLIC PANEL - Abnormal; Notable for the following components:   Potassium 3.4 (*)    Calcium 8.3 (*)    All other components within normal limits  LACTATE DEHYDROGENASE - Abnormal; Notable for the following components:   LDH 202 (*)    All  other components within normal limits  FIBRINOGEN - Abnormal; Notable for the following components:   Fibrinogen 586 (*)    All other components within normal limits  C-REACTIVE PROTEIN - Abnormal; Notable for the following components:   CRP 4.1 (*)    All other components within normal limits  CULTURE, BLOOD (ROUTINE X 2)  CULTURE, BLOOD (ROUTINE X 2)  LACTIC ACID, PLASMA  CBC WITH DIFFERENTIAL/PLATELET  D-DIMER, QUANTITATIVE (NOT AT [facility])  PROCALCITONIN  FERRITIN  TRIGLYCERIDES  POC SARS CORONAVIRUS 2 AG -  ED    EKG EKG Interpretation  Date/Time:  Saturday May 12 2019 10:34:25 EST Ventricular Rate:  72 PR Interval:    QRS Duration: 103 QT Interval:  375 QTC Calculation: 411 R Axis:   8 Text Interpretation: Sinus rhythm RSR' in V1 or V2, right VCD or RVH Confirmed by Davonna Belling [phone]) on 05/12/2019 10:46:31 AM   Radiology DG Chest Portable 1 View  Result Date: 05/12/2019 CLINICAL DATA:  High-grade fever, nonproductive cough and fatigue. COVID exposure. EXAM: PORTABLE CHEST 1 VIEW COMPARISON:  None. FINDINGS: The heart size and mediastinal contours are within normal limits. The lungs are suboptimally inflated. Both lungs are clear. The visualized skeletal structures are unremarkable. IMPRESSION: 1. No active cardiopulmonary abnormalities. 2. Low lung volumes. Electronically Signed   By: Kerby Moors M.D.   On: 05/12/2019 11:32    Procedures Procedures (including critical care time)  Medications Ordered in ED Medications - No data to display  ED Course  I have reviewed the triage vital signs and the nursing notes.  Pertinent labs & imaging results that were available during my care of the patient were reviewed by me and considered in my medical decision making (see chart for details).    MDM Rules/Calculators/A&P                      Patient with shortness of breath cough and weakness.  Found to have COVID-19.  X-ray reassuring.  Not hypoxic but did have  a single measurement of 90%.  Stands up and moves around in the room without hypoxia.  Will discharge home with outpatient follow-up as needed. Final Clinical Impression(s) / ED Diagnoses Final diagnoses:  [id number]    Rx / DC Orders ED Discharge Orders         Ordered    MyChart COVID-19 home monitoring program     05/12/19 1305    Temperature monitoring     05/12/19 1305  Davonna Belling, MD 05/12/19 [phone]

## 2019-05-12 NOTE — ED Triage Notes (Signed)
Pt reports several days of fever, cough, and weakness. Daughter had covid.

## 2019-05-17 LAB — CULTURE, BLOOD (ROUTINE X 2)
Culture: NO GROWTH
Culture: NO GROWTH
Special Requests: ADEQUATE
Special Requests: ADEQUATE

## 2019-05-23 ENCOUNTER — Ambulatory Visit: Payer: No Typology Code available for payment source | Attending: Internal Medicine

## 2019-05-23 DIAGNOSIS — Z20822 Contact with and (suspected) exposure to covid-19: Secondary | ICD-10-CM

## 2019-05-24 LAB — NOVEL CORONAVIRUS, NAA: SARS-CoV-2, NAA: NOT DETECTED

## 2020-02-23 IMAGING — CR DG CHEST 1V PORT
1 series · 1 of 1 positions shown · non-contrast
Comparison: None.

CLINICAL DATA: High-grade fever, nonproductive cough and fatigue.
COVID exposure.

EXAM:
PORTABLE CHEST 1 VIEW

[portable]
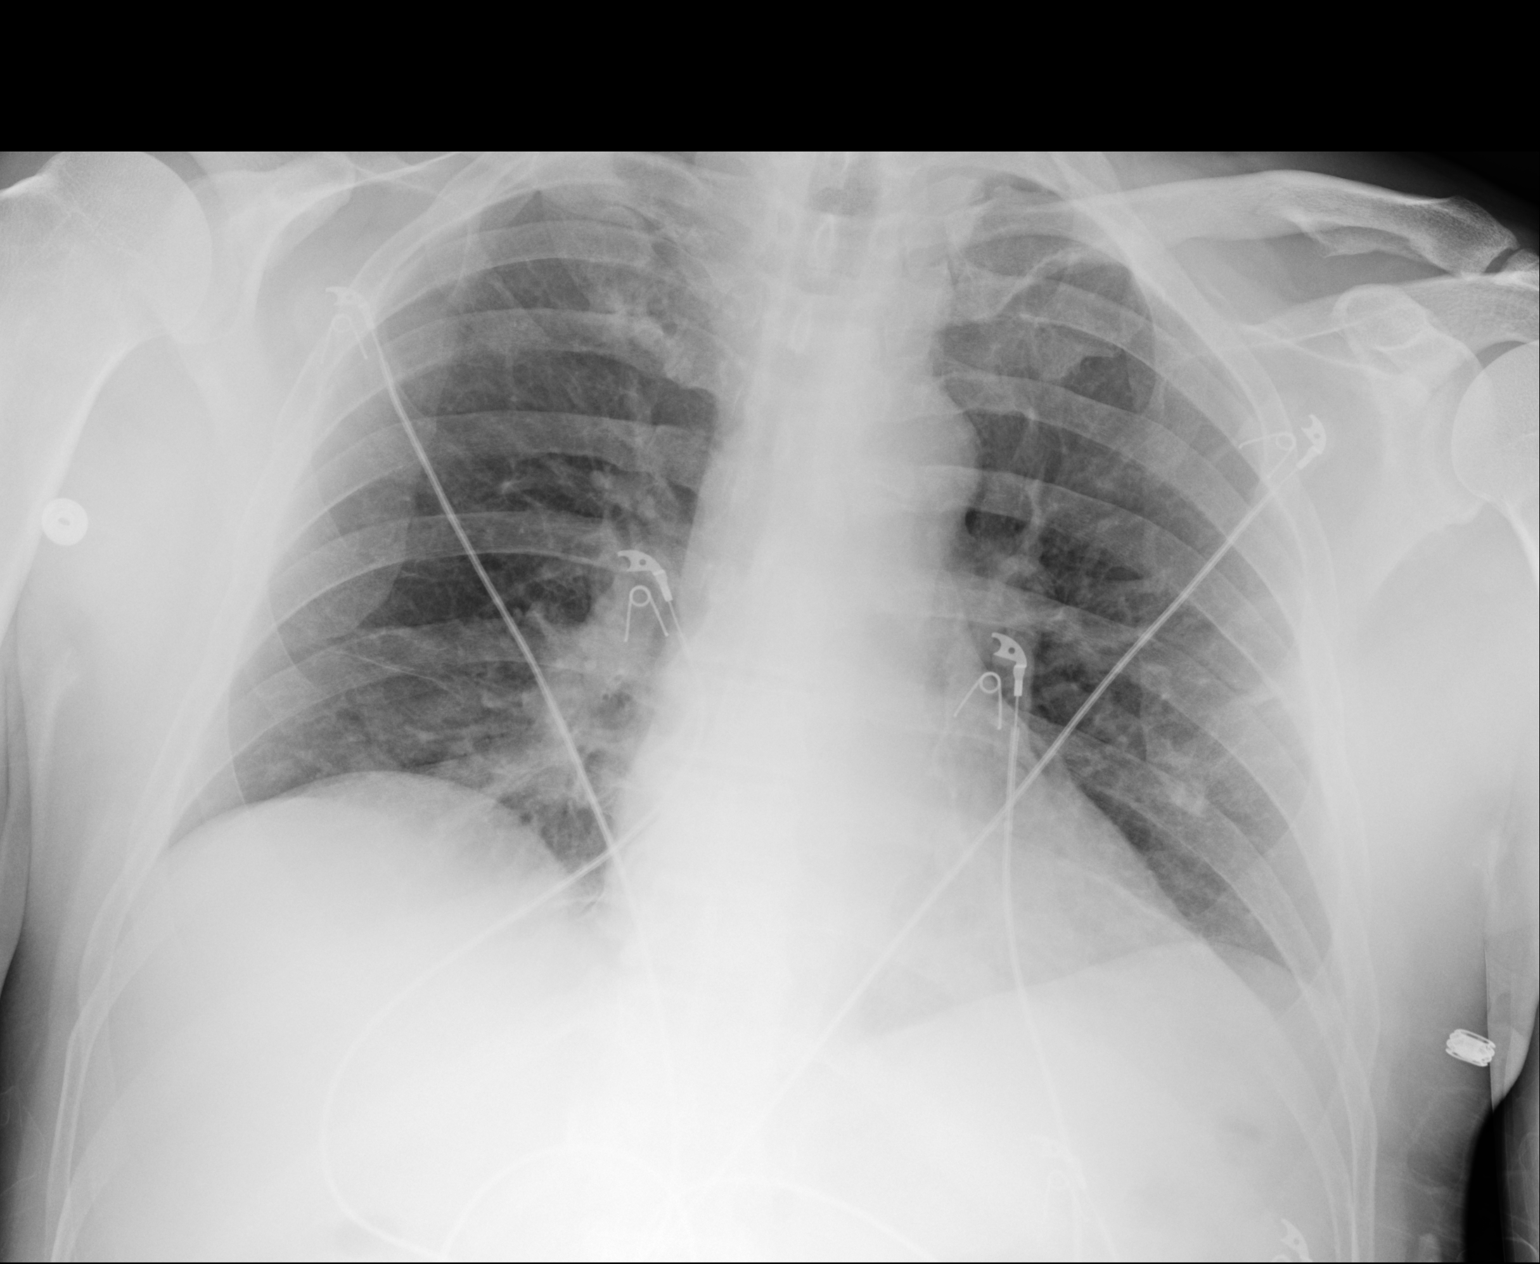

[1 of 1 positions shown; findings below may reference images not displayed]

FINDINGS: The heart size and mediastinal contours are within normal limits.
The lungs are suboptimally inflated. Both lungs are clear. The
visualized skeletal structures are unremarkable.
IMPRESSION: 1. No active cardiopulmonary abnormalities.
2. Low lung volumes.

## 2021-12-23 ENCOUNTER — Encounter: Payer: Self-pay | Admitting: *Deleted
